# Patient Record
Sex: Male | Born: 1985 | Hispanic: No | Marital: Married | Smoking: Current every day smoker
Health system: Southern US, Community
[De-identification: ages and names within clinical notes are randomized; demographics above are authoritative.]

---

## 2019-08-17 ENCOUNTER — Emergency Department (HOSPITAL_COMMUNITY)
Admission: EM | Admit: 2019-08-17 | Discharge: 2019-08-17 | Disposition: A | Discharge status: Home / Self Care | Payer: Medicaid Other | Attending: Emergency Medicine | Admitting: Emergency Medicine

## 2019-08-17 ENCOUNTER — Encounter (HOSPITAL_COMMUNITY): Payer: Self-pay | Admitting: Emergency Medicine

## 2019-08-17 ENCOUNTER — Other Ambulatory Visit: Payer: Self-pay

## 2019-08-17 DIAGNOSIS — R109 Unspecified abdominal pain: Secondary | ICD-10-CM | POA: Diagnosis not present

## 2019-08-17 DIAGNOSIS — R82998 Other abnormal findings in urine: Secondary | ICD-10-CM | POA: Insufficient documentation

## 2019-08-17 DIAGNOSIS — M545 Low back pain: Secondary | ICD-10-CM | POA: Insufficient documentation

## 2019-08-17 DIAGNOSIS — R300 Dysuria: Secondary | ICD-10-CM | POA: Insufficient documentation

## 2019-08-17 DIAGNOSIS — R3989 Other symptoms and signs involving the genitourinary system: Secondary | ICD-10-CM

## 2019-08-17 LAB — URINALYSIS, ROUTINE W REFLEX MICROSCOPIC
Bilirubin Urine: NEGATIVE
Glucose, UA: NEGATIVE mg/dL
Hgb urine dipstick: NEGATIVE
Ketones, ur: NEGATIVE mg/dL
Leukocytes,Ua: NEGATIVE
Nitrite: NEGATIVE
Protein, ur: NEGATIVE mg/dL
Specific Gravity, Urine: 1.025 (ref 1.005–1.030)
pH: 5 (ref 5.0–8.0)

## 2019-08-17 LAB — COMPREHENSIVE METABOLIC PANEL
ALT: 25 U/L (ref 0–44)
AST: 21 U/L (ref 15–41)
Albumin: 4 g/dL (ref 3.5–5.0)
Alkaline Phosphatase: 68 U/L (ref 38–126)
Anion gap: 9 (ref 5–15)
BUN: 15 mg/dL (ref 6–20)
CO2: 30 mmol/L (ref 22–32)
Calcium: 10 mg/dL (ref 8.9–10.3)
Chloride: 103 mmol/L (ref 98–111)
Creatinine, Ser: 0.9 mg/dL (ref 0.61–1.24)
GFR calc Af Amer: >60 mL/min (ref >60)
GFR calc non Af Amer: >60 mL/min (ref >60)
Glucose, Bld: 101 mg/dL — ABNORMAL HIGH (ref 70–99)
Potassium: 4.3 mmol/L (ref 3.5–5.1)
Sodium: 142 mmol/L (ref 135–145)
Total Bilirubin: 0.4 mg/dL (ref 0.3–1.2)
Total Protein: 7.5 g/dL (ref 6.5–8.1)

## 2019-08-17 LAB — CBC WITH DIFFERENTIAL/PLATELET
Abs Immature Granulocytes: 0.05 10*3/uL (ref 0.00–0.07)
Basophils Absolute: 0 10*3/uL (ref 0.0–0.1)
Basophils Relative: 0 %
Eosinophils Absolute: 0.2 10*3/uL (ref 0.0–0.5)
Eosinophils Relative: 2 %
HCT: 46.1 % (ref 39.0–52.0)
Hemoglobin: 15.1 g/dL (ref 13.0–17.0)
Immature Granulocytes: 1 %
Lymphocytes Relative: 19 %
Lymphs Abs: 1.7 10*3/uL (ref 0.7–4.0)
MCH: 29.3 pg (ref 26.0–34.0)
MCHC: 32.8 g/dL (ref 30.0–36.0)
MCV: 89.3 fL (ref 80.0–100.0)
Monocytes Absolute: 0.6 10*3/uL (ref 0.1–1.0)
Monocytes Relative: 6 %
Neutro Abs: 6.4 10*3/uL (ref 1.7–7.7)
Neutrophils Relative %: 72 %
Platelets: 270 10*3/uL (ref 150–400)
RBC: 5.16 MIL/uL (ref 4.22–5.81)
RDW: 12.9 % (ref 11.5–15.5)
WBC: 9 10*3/uL (ref 4.0–10.5)
nRBC: 0 % (ref 0.0–0.2)

## 2019-08-17 NOTE — ED Triage Notes (Signed)
Patient complains of hematuria since early am. Denies abdominal pain, no back pain but complains of tingling to left side

## 2019-08-17 NOTE — Discharge Instructions (Signed)
Your urinalysis does not show any acute abnormalities. Follow-up with your primary care physician if you continue to have discoloration. Be sure to drink plenty of fluids.

## 2019-08-17 NOTE — ED Notes (Signed)
Patient verbalizes understanding of discharge instructions . Opportunity for questions and answers were provided . Armband removed by staff ,Pt discharged from ED. W/C  offered at D/C  and Declined W/C at D/C and was escorted to lobby by RN.  

## 2019-08-17 NOTE — ED Provider Notes (Signed)
[facility] EMERGENCY DEPARTMENT Provider Note   CSN: [id number] Arrival date & time: 08/17/19  1504     History No chief complaint on file.   Gene Green is a 34 y.o. male.  HPI 34 year old male presents with brown urine/possibly bloody urine.  Started this morning when he first went to the bathroom and then he went again and it was still brownish.  He has a little bit of dysuria at the end of urination over the last week or so.  Some abdominal discomfort like he is distended for a while.  Has chronic low back pain for over a month that is not that bad right now.  No flank pain.  No penile discharge, penile pain, scrotal pain or swelling.  No concern for STI. No muscle pain. Denies feeling dehydrated.  History reviewed. No pertinent past medical history.  There are no problems to display for this patient.   History reviewed. No pertinent surgical history.     No family history on file.  Social History   Tobacco Use  . Smoking status: Not on file  Substance Use Topics  . Alcohol use: Not on file  . Drug use: Not on file    Home Medications Prior to Admission medications   Not on File    Allergies    Patient has no known allergies.  Review of Systems   Review of Systems  Constitutional: Negative for fever.  Gastrointestinal: Negative for abdominal pain.  Genitourinary: Positive for dysuria and hematuria. Negative for discharge, flank pain, penile pain, penile swelling and scrotal swelling.  Musculoskeletal: Positive for back pain.  All other systems reviewed and are negative.   Physical Exam Updated Vital Signs BP 114/78 (BP Location: Right Arm)   Pulse 96   Temp 98.3 F (36.8 C) (Oral)   Resp 16   SpO2 98%   Physical Exam Vitals and nursing note reviewed.  Constitutional:      Appearance: He is well-developed. He is obese.  HENT:     Head: Normocephalic and atraumatic.     Right Ear: External ear normal.     Left Ear: External  ear normal.     Nose: Nose normal.  Eyes:     General:        Right eye: No discharge.        Left eye: No discharge.  Cardiovascular:     Rate and Rhythm: Normal rate and regular rhythm.     Heart sounds: Normal heart sounds.  Pulmonary:     Effort: Pulmonary effort is normal.     Breath sounds: Normal breath sounds.  Abdominal:     Palpations: Abdomen is soft.     Tenderness: There is no abdominal tenderness. There is no right CVA tenderness or left CVA tenderness.  Genitourinary:    Penis: Circumcised. No erythema, tenderness, discharge, swelling or lesions.      Testes:        Right: Tenderness or swelling not present.        Left: Tenderness or swelling not present.  Musculoskeletal:     Cervical back: Neck supple.     Thoracic back: No tenderness.     Lumbar back: No tenderness.  Skin:    General: Skin is warm and dry.  Neurological:     Mental Status: He is alert.  Psychiatric:        Mood and Affect: Mood is not anxious.     ED Results / Procedures /  Treatments   Labs (all labs ordered are listed, but only abnormal results are displayed) Labs Reviewed  COMPREHENSIVE METABOLIC PANEL - Abnormal; Notable for the following components:      Result Value   Glucose, Bld 101 (*)    All other components within normal limits  URINALYSIS, ROUTINE W REFLEX MICROSCOPIC  CBC WITH DIFFERENTIAL/PLATELET    EKG None  Radiology No results found.  Procedures Procedures (including critical care time)  Medications Ordered in ED Medications - No data to display  ED Course  I have reviewed the triage vital signs and the nursing notes.  Pertinent labs & imaging results that were available during my care of the patient were reviewed by me and considered in my medical decision making (see chart for details).    MDM Rules/Calculators/A&P                          Patient's lab work and urine has been reviewed and is very unremarkable.  Urinalysis is completely normal.   He showed me a picture of his urine and it looks more like dark concentrated urine rather than blood.  I encouraged him to drink more fluids.  At this point, with a benign exam, I do not think further work-up is needed.  Follow-up with PCP as needed or if not improving. Final Clinical Impression(s) / ED Diagnoses Final diagnoses:  Urine discoloration    Rx / DC Orders ED Discharge Orders    None       Pricilla Loveless, MD 08/17/19 1920

## 2019-08-30 ENCOUNTER — Other Ambulatory Visit: Payer: Self-pay

## 2019-08-30 ENCOUNTER — Emergency Department (HOSPITAL_COMMUNITY)
Admission: EM | Admit: 2019-08-30 | Discharge: 2019-08-30 | Disposition: A | Discharge status: Home / Self Care | Payer: Medicaid Other | Attending: Emergency Medicine | Admitting: Emergency Medicine

## 2019-08-30 ENCOUNTER — Encounter (HOSPITAL_COMMUNITY): Payer: Self-pay | Admitting: Emergency Medicine

## 2019-08-30 DIAGNOSIS — B369 Superficial mycosis, unspecified: Secondary | ICD-10-CM | POA: Insufficient documentation

## 2019-08-30 DIAGNOSIS — L732 Hidradenitis suppurativa: Secondary | ICD-10-CM

## 2019-08-30 DIAGNOSIS — R21 Rash and other nonspecific skin eruption: Secondary | ICD-10-CM | POA: Diagnosis present

## 2019-08-30 MED ORDER — CLOTRIMAZOLE 1 % EX CREA
TOPICAL_CREAM | CUTANEOUS | 0 refills | Status: AC
Start: 2019-08-30 — End: ?

## 2019-08-30 MED ORDER — CLINDAMYCIN PHOSPHATE 1 % EX SOLN: Freq: Two times a day (BID) | CUTANEOUS | 0 refills | Status: AC

## 2019-08-30 NOTE — ED Notes (Signed)
Pt discharge instructions and prescriptions reviewed with the patient. The patient verbalized understanding of instructions. Pt discharged. 

## 2019-08-30 NOTE — ED Provider Notes (Signed)
[facility] EMERGENCY DEPARTMENT Provider Note   CSN: [id number] Arrival date & time: 08/30/19  3976     History Chief Complaint  Patient presents with   Recurrent Skin Infections   Rash    Gene Green is a 34 y.o. male.  34 year old male with history of hydradenitis presents with complaint of redness and itching in his left axilla. Patient states he had an abscess 2 weeks ago and was seen at [facility] who prescribed 7 days of QID Keflex. Patient completed the course and then went to the health department who prescribed cortisone cream which he has not been using. Patient has been using an acne facewash on his left axilla, now with itching and redness/pain. No fevers, no other complaints or concerns.         History reviewed. No pertinent past medical history.  There are no problems to display for this patient.   History reviewed. No pertinent surgical history.     No family history on file.  Social History   Tobacco Use   Smoking status: Not on file  Substance Use Topics   Alcohol use: Not on file   Drug use: Not on file    Home Medications Prior to Admission medications   Medication Sig Start Date End Date Taking? Authorizing Provider  cephALEXin (KEFLEX) 500 MG capsule Take 500 mg by mouth 4 (four) times daily. 08/18/19  Yes [provider]  Cholecalciferol 1.25 MG (50000 UT) capsule Take 50,000 Units by mouth once a week. 08/27/19  Yes [provider]  clindamycin (CLEOCIN-T) 1 % external solution Apply topically 2 (two) times daily. 08/30/19   Jeannie Fend, PA-C  clotrimazole (LOTRIMIN) 1 % cream Apply to affected area 2 times daily 08/30/19   Jeannie Fend, PA-C    Allergies    Patient has no known allergies.  Review of Systems   Review of Systems  Constitutional: Negative for fever.  Musculoskeletal: Negative for arthralgias and myalgias.  Skin: Positive for rash.  Allergic/Immunologic: Negative for  immunocompromised state.  Neurological: Negative for weakness and numbness.  Hematological: Negative for adenopathy.    Physical Exam Updated Vital Signs BP (!) 139/92    Pulse 93    Temp 98.2 F (36.8 C)    Resp 15    SpO2 100%   Physical Exam Vitals and nursing note reviewed.  Constitutional:      General: He is not in acute distress.    Appearance: He is well-developed. He is not diaphoretic.  HENT:     Head: Normocephalic and atraumatic.  Cardiovascular:     Pulses: Normal pulses.  Pulmonary:     Effort: Pulmonary effort is normal.  Musculoskeletal:        General: No swelling or tenderness.  Skin:    General: Skin is dry.     Findings: Erythema and rash present.       Neurological:     Mental Status: He is alert and oriented to person, place, and time.  Psychiatric:        Behavior: Behavior normal.     ED Results / Procedures / Treatments   Labs (all labs ordered are listed, but only abnormal results are displayed) Labs Reviewed - No data to display  EKG None  Radiology No results found.  Procedures Procedures (including critical care time)  Medications Ordered in ED Medications - No data to display  ED Course  I have reviewed the triage vital signs and  the nursing notes.  Pertinent labs & imaging results that were available during my care of the patient were reviewed by me and considered in my medical decision making (see chart for details).  Clinical Course as of Aug 30 935  Fri Aug 30, 2019  6720 34 year old male with history of hidradenitis presents with complaint of redness and itching to left axilla after taking Keflex for recent infection and now using acne face wash to the area.  Discussed with patient possible contact reaction versus fungal infection in the area.  Recommend clindamycin topically to area for his hidradenitis as well as Lotrimin cream for possible fungal infection.  Patient has not been using the cortisone cream prescribed by  the health department, consider use if not improving after 10 days of Lotrimin.  Patient recently moved to this area, requesting information on dermatologist and PCP, has Medicaid.  Patient given information for [place] and wellness also advised to contact his Medicaid provider and given list of area dermatologist although no dermatology specifically available for referral from our facility.   [LM]    Clinical Course User Index [LM] Alden Hipp   MDM Rules/Calculators/A&P                          Final Clinical Impression(s) / ED Diagnoses Final diagnoses:  Hydradenitis  Fungal infection of skin    Rx / DC Orders ED Discharge Orders         Ordered    clindamycin (CLEOCIN-T) 1 % external solution  2 times daily     Discontinue  Reprint     08/30/19 0924    clotrimazole (LOTRIMIN) 1 % cream     Discontinue  Reprint     08/30/19 0924           Jeannie Fend, PA-C 08/30/19 7939    Terald Sleeper, MD 08/30/19 1023

## 2019-08-30 NOTE — ED Triage Notes (Signed)
Pt reports a boil to L axilla that he was treated with keflex for from [facility], states he is done with abx but boil is still draining and he has a rash around the area. Denies fevers.

## 2019-08-30 NOTE — Discharge Instructions (Addendum)
Apply Clindamycin daily to control the hydradenitis. Apply the Lotrimin to area to help with the redness and itching. Follow up with a primary care provider.

## 2019-10-26 ENCOUNTER — Encounter (HOSPITAL_COMMUNITY): Payer: Self-pay | Admitting: Emergency Medicine

## 2019-10-26 ENCOUNTER — Emergency Department (HOSPITAL_COMMUNITY)
Admission: EM | Admit: 2019-10-26 | Discharge: 2019-10-26 | Disposition: A | Discharge status: Home / Self Care | Payer: Medicaid Other | Attending: Emergency Medicine | Admitting: Emergency Medicine

## 2019-10-26 ENCOUNTER — Emergency Department (HOSPITAL_COMMUNITY): Payer: Medicaid Other

## 2019-10-26 ENCOUNTER — Other Ambulatory Visit: Payer: Self-pay

## 2019-10-26 DIAGNOSIS — Z79899 Other long term (current) drug therapy: Secondary | ICD-10-CM | POA: Diagnosis not present

## 2019-10-26 DIAGNOSIS — J069 Acute upper respiratory infection, unspecified: Secondary | ICD-10-CM | POA: Diagnosis not present

## 2019-10-26 DIAGNOSIS — R05 Cough: Secondary | ICD-10-CM | POA: Diagnosis present

## 2019-10-26 DIAGNOSIS — Z20822 Contact with and (suspected) exposure to covid-19: Secondary | ICD-10-CM | POA: Insufficient documentation

## 2019-10-26 DIAGNOSIS — F17210 Nicotine dependence, cigarettes, uncomplicated: Secondary | ICD-10-CM | POA: Insufficient documentation

## 2019-10-26 LAB — GROUP A STREP BY PCR: Group A Strep by PCR: NOT DETECTED

## 2019-10-26 LAB — SARS CORONAVIRUS 2 BY RT PCR (HOSPITAL ORDER, PERFORMED IN ~~LOC~~ HOSPITAL LAB): SARS Coronavirus 2: NEGATIVE

## 2019-10-26 NOTE — ED Notes (Signed)
Patient transported to X-ray 

## 2019-10-26 NOTE — ED Notes (Signed)
Patient Alert and oriented to baseline. Stable and ambulatory to baseline. Patient verbalized understanding of the discharge instructions.  Patient belongings were taken by the patient.   

## 2019-10-26 NOTE — ED Provider Notes (Signed)
[facility] EMERGENCY DEPARTMENT Provider Note   CSN: [id number] Arrival date & time: 10/26/19  0831     History Chief Complaint  Patient presents with  . Cough  . Sore Throat    Gene Green is a 34 y.o. male who presents to the ED today with complaint of gradual onset, constant, dry cough x 3 days. Pt also complains of a sore throat and pain to his left ribs. He reports that his wife and child had similar symptoms earlier in the week - his wife tested negative for COVID. Pt is fully vaccinated; received Moderna vaccine in April and May. He has been taking OTC medication without relief. Denies fevers however has had some chills. No other complaints at this time.   The history is provided by the patient and medical records.       History reviewed. No pertinent past medical history.  There are no problems to display for this patient.   History reviewed. No pertinent surgical history.     No family history on file.  Social History   Tobacco Use  . Smoking status: Current Every Day Smoker  . Smokeless tobacco: Never Used  Substance Use Topics  . Alcohol use: Not Currently  . Drug use: Not Currently    Home Medications Prior to Admission medications   Medication Sig Start Date End Date Taking? Authorizing Provider  cephALEXin (KEFLEX) 500 MG capsule Take 500 mg by mouth 4 (four) times daily. Patient not taking: Reported on 08/30/2019 08/18/19   [provider]  Cholecalciferol 1.25 MG (50000 UT) capsule Take 50,000 Units by mouth once a week. 08/27/19   [provider]  clindamycin (CLEOCIN-T) 1 % external solution Apply topically 2 (two) times daily. 08/30/19   Jeannie Fend, PA-C  clotrimazole (LOTRIMIN) 1 % cream Apply to affected area 2 times daily 08/30/19   Jeannie Fend, PA-C    Allergies    Patient has no known allergies.  Review of Systems   Review of Systems  Constitutional: Positive for chills and fatigue. Negative for  fever.  HENT: Positive for sore throat. Negative for trouble swallowing.   Respiratory: Positive for cough. Negative for shortness of breath.     Physical Exam Updated Vital Signs BP (!) 132/114 (BP Location: Left Arm)   Pulse 97   Temp 98.1 F (36.7 C) (Oral)   Resp 18   SpO2 100%   Physical Exam Vitals and nursing note reviewed.  Constitutional:      Appearance: He is not ill-appearing or diaphoretic.  HENT:     Head: Normocephalic and atraumatic.     Right Ear: Tympanic membrane and ear canal normal.     Left Ear: Tympanic membrane and ear canal normal.     Mouth/Throat:     Mouth: Mucous membranes are moist.     Pharynx: Uvula midline. Posterior oropharyngeal erythema present. No oropharyngeal exudate or uvula swelling.     Comments: + post nasal drip Eyes:     Conjunctiva/sclera: Conjunctivae normal.  Cardiovascular:     Rate and Rhythm: Normal rate and regular rhythm.     Heart sounds: Normal heart sounds.  Pulmonary:     Effort: Pulmonary effort is normal.     Breath sounds: Normal breath sounds. No wheezing, rhonchi or rales.     Comments: Actively coughing in the room. Able to speak in full sentences without difficulty. Satting 100% on RA.  Skin:    General: Skin is warm  and dry.     Coloration: Skin is not jaundiced.  Neurological:     Mental Status: He is alert.     ED Results / Procedures / Treatments   Labs (all labs ordered are listed, but only abnormal results are displayed) Labs Reviewed  SARS CORONAVIRUS 2 BY RT PCR (HOSPITAL ORDER, PERFORMED IN  HOSPITAL LAB)  GROUP A STREP BY PCR    EKG None  Radiology DG Chest 2 View  Result Date: 10/26/2019 CLINICAL DATA:  Cough and congestion.  Fever. EXAM: CHEST - 2 VIEW COMPARISON:  None. FINDINGS: Lungs are clear. Heart size and pulmonary vascularity are normal. No adenopathy. No bone lesions. IMPRESSION: No abnormality noted. Electronically Signed   By: Bretta Bang III M.D.   On:  10/26/2019 11:23    Procedures Procedures (including critical care time)  Medications Ordered in ED Medications - No data to display  ED Course  I have reviewed the triage vital signs and the nursing notes.  Pertinent labs & imaging results that were available during my care of the patient were reviewed by me and considered in my medical decision making (see chart for details).    MDM Rules/Calculators/A&P                          34 year old male who presents to the ED today complaining of cough, sore throat, left-sided pain for the past couple of days.  His wife and child recently had similar symptoms, left is negative for Covid.  Patient is vaccinated.  On arrival to the ED patient is afebrile, nontachycardic nontachypneic.  He was tested for strep as well as Covid while in the waiting room, both are negative.  On my exam today patient is actively coughing in the room however lungs are clear to auscultation bilaterally.  He does not appear to be in any type of respiratory distress.  He is noted to have some postnasal drip on exam as well.  He reports he feels like he is a pressure in his head.  I suspect this is related to his congestion.  We will plan for chest x-ray at this time to evaluate for any type of pneumonia however I suspect his symptoms are due to an upper respiratory infection with cough.  We will plan for symptomatic treatment.   CXR negative. Will plan to discharge home at this time. Pt is requesting something to help with the congestion he feels in his chest - recommend Mucinex OTC. Will provide flonase as well as I feel this will help with dry out his nasal passages/post nasal drip. Pt instructed to follow up with his PCP. He is in agreement with plan and stable for discharge home.   This note was prepared using Dragon voice recognition software and may include unintentional dictation errors due to the inherent limitations of voice recognition software.  Gene Green was  evaluated in Emergency Department on 10/26/2019 for the symptoms described in the history of present illness. He was evaluated in the context of the global COVID-19 pandemic, which necessitated consideration that the patient might be at risk for infection with the SARS-CoV-2 virus that causes COVID-19. Institutional protocols and algorithms that pertain to the evaluation of patients at risk for COVID-19 are in a state of rapid change based on information released by regulatory bodies including the CDC and federal and state organizations. These policies and algorithms were followed during the patient's care in the ED.  Final Clinical Impression(s) / ED Diagnoses Final diagnoses:  Viral URI with cough    Rx / DC Orders ED Discharge Orders    None       Discharge Instructions     Your lab work and chest xray did not show any abnormalities. I suspect your symptoms are related to a virus and should go away in a few days.   I would recommend OTC Mucinex DM as well as Flonase to help with your congestion.   Follow up with your PCP regarding your ED visit. If you do not have one you can follow up with [facility] Wellness for primary care needs.   Return to the ED for any worsening symptoms       Tanda Rockers, PA-C 10/26/19 1205    Terald Sleeper, MD 10/26/19 [phone]

## 2019-10-26 NOTE — ED Triage Notes (Signed)
Pt. Stated, I ve had sore throat , cough since Wednesday. My left side hurts also.

## 2019-10-26 NOTE — Discharge Instructions (Signed)
Your lab work and chest xray did not show any abnormalities. I suspect your symptoms are related to a virus and should go away in a few days.   I would recommend OTC Mucinex DM as well as Flonase to help with your congestion.   Follow up with your PCP regarding your ED visit. If you do not have one you can follow up with [facility] Well[facility] for primary care needs.   Return to the ED for any worsening symptoms

## 2020-02-29 ENCOUNTER — Emergency Department (HOSPITAL_COMMUNITY)
Admission: EM | Admit: 2020-02-29 | Discharge: 2020-02-29 | Disposition: A | Discharge status: Home / Self Care | Payer: BC Managed Care – PPO | Attending: Emergency Medicine | Admitting: Emergency Medicine

## 2020-02-29 ENCOUNTER — Other Ambulatory Visit: Payer: Self-pay

## 2020-02-29 ENCOUNTER — Emergency Department (HOSPITAL_COMMUNITY): Payer: BC Managed Care – PPO

## 2020-02-29 DIAGNOSIS — U071 COVID-19: Secondary | ICD-10-CM | POA: Insufficient documentation

## 2020-02-29 DIAGNOSIS — R509 Fever, unspecified: Secondary | ICD-10-CM | POA: Diagnosis present

## 2020-02-29 DIAGNOSIS — F17200 Nicotine dependence, unspecified, uncomplicated: Secondary | ICD-10-CM | POA: Insufficient documentation

## 2020-02-29 LAB — COMPREHENSIVE METABOLIC PANEL
ALT: 21 U/L (ref 0–44)
AST: 21 U/L (ref 15–41)
Albumin: 4 g/dL (ref 3.5–5.0)
Alkaline Phosphatase: 53 U/L (ref 38–126)
Anion gap: 10 (ref 5–15)
BUN: 13 mg/dL (ref 6–20)
CO2: 27 mmol/L (ref 22–32)
Calcium: 9.5 mg/dL (ref 8.9–10.3)
Chloride: 98 mmol/L (ref 98–111)
Creatinine, Ser: 0.92 mg/dL (ref 0.61–1.24)
GFR, Estimated: >60 mL/min (ref >60)
Glucose, Bld: 126 mg/dL — ABNORMAL HIGH (ref 70–99)
Potassium: 3.6 mmol/L (ref 3.5–5.1)
Sodium: 135 mmol/L (ref 135–145)
Total Bilirubin: 0.6 mg/dL (ref 0.3–1.2)
Total Protein: 7.3 g/dL (ref 6.5–8.1)

## 2020-02-29 LAB — URINALYSIS, ROUTINE W REFLEX MICROSCOPIC
Bilirubin Urine: NEGATIVE
Glucose, UA: NEGATIVE mg/dL
Hgb urine dipstick: NEGATIVE
Ketones, ur: 5 mg/dL — AB
Leukocytes,Ua: NEGATIVE
Nitrite: NEGATIVE
Protein, ur: NEGATIVE mg/dL
Specific Gravity, Urine: 1.024 (ref 1.005–1.030)
pH: 6 (ref 5.0–8.0)

## 2020-02-29 LAB — CBC
HCT: 46 % (ref 39.0–52.0)
Hemoglobin: 14.8 g/dL (ref 13.0–17.0)
MCH: 28.4 pg (ref 26.0–34.0)
MCHC: 32.2 g/dL (ref 30.0–36.0)
MCV: 88.3 fL (ref 80.0–100.0)
Platelets: 233 10*3/uL (ref 150–400)
RBC: 5.21 MIL/uL (ref 4.22–5.81)
RDW: 14.1 % (ref 11.5–15.5)
WBC: 6.1 10*3/uL (ref 4.0–10.5)
nRBC: 0 % (ref 0.0–0.2)

## 2020-02-29 LAB — LIPASE, BLOOD: Lipase: 18 U/L (ref 11–51)

## 2020-02-29 LAB — POC SARS CORONAVIRUS 2 AG -  ED: SARS Coronavirus 2 Ag: POSITIVE — AB

## 2020-02-29 MED ORDER — ACETAMINOPHEN 500 MG PO TABS
1000.0000 mg | ORAL_TABLET | Freq: Once | ORAL | Status: AC
  Administered 2020-02-29: 1000 mg via ORAL
  Filled 2020-02-29: qty 2

## 2020-02-29 NOTE — ED Provider Notes (Signed)
[facility] EMERGENCY DEPARTMENT Provider Note   CSN: [id number] Arrival date & time: 02/29/20  1138     History Chief Complaint  Patient presents with  . Fever    Emry Barbato is a 35 y.o. male.  The history is provided by the patient.  Fever Temp source:  Subjective Severity:  Mild Onset quality:  Gradual Timing:  Intermittent Progression:  Waxing and waning Chronicity:  New Relieved by:  Nothing Worsened by:  Nothing Associated symptoms: chills and myalgias   Associated symptoms: no chest pain, no cough, no diarrhea, no dysuria, no ear pain, no rash, no sore throat and no vomiting        History reviewed. No pertinent past medical history.  There are no problems to display for this patient.   History reviewed. No pertinent surgical history.     No family history on file.  Social History   Tobacco Use  . Smoking status: Current Every Day Smoker  . Smokeless tobacco: Never Used  Substance Use Topics  . Alcohol use: Not Currently  . Drug use: Not Currently    Home Medications Prior to Admission medications   Medication Sig Start Date End Date Taking? Authorizing Provider  cephALEXin (KEFLEX) 500 MG capsule Take 500 mg by mouth 4 (four) times daily. Patient not taking: Reported on 08/30/2019 08/18/19   [provider]  Cholecalciferol 1.25 MG (50000 UT) capsule Take 50,000 Units by mouth once a week. 08/27/19   [provider]  clindamycin (CLEOCIN-T) 1 % external solution Apply topically 2 (two) times daily. 08/30/19   Jeannie Fend, PA-C  clotrimazole (LOTRIMIN) 1 % cream Apply to affected area 2 times daily 08/30/19   Jeannie Fend, PA-C    Allergies    Patient has no known allergies.  Review of Systems   Review of Systems  Constitutional: Positive for chills and fever.  HENT: Negative for ear pain and sore throat.   Eyes: Negative for pain and visual disturbance.  Respiratory: Negative for cough and shortness  of breath.   Cardiovascular: Negative for chest pain and palpitations.  Gastrointestinal: Positive for abdominal pain (epigastric). Negative for diarrhea and vomiting.  Genitourinary: Negative for dysuria and hematuria.  Musculoskeletal: Positive for myalgias. Negative for arthralgias and back pain.  Skin: Negative for color change and rash.  Neurological: Negative for seizures and syncope.  All other systems reviewed and are negative.   Physical Exam Updated Vital Signs  ED Triage Vitals  Enc Vitals Group     BP 02/29/20 1211 115/81     Pulse Rate 02/29/20 1211 (!) 129     Resp 02/29/20 1211 20     Temp 02/29/20 1211 99.6 F (37.6 C)     Temp Source 02/29/20 1211 Oral     SpO2 02/29/20 1211 100 %     Weight --      Height --      Head Circumference --      Peak Flow --      Pain Score 02/29/20 1213 4     Pain Loc --      Pain Edu? --      Excl. in GC? --     Physical Exam Vitals and nursing note reviewed.  Constitutional:      General: He is not in acute distress.    Appearance: He is well-developed and well-nourished. He is not ill-appearing.  HENT:     Head: Normocephalic and atraumatic.  Nose: Nose normal.     Mouth/Throat:     Mouth: Mucous membranes are moist.  Eyes:     Extraocular Movements: Extraocular movements intact.     Conjunctiva/sclera: Conjunctivae normal.     Pupils: Pupils are equal, round, and reactive to light.  Cardiovascular:     Rate and Rhythm: Normal rate and regular rhythm.     Heart sounds: No murmur heard.   Pulmonary:     Effort: Pulmonary effort is normal. No respiratory distress.     Breath sounds: Normal breath sounds.  Abdominal:     General: Abdomen is flat.     Palpations: Abdomen is soft.     Tenderness: There is no abdominal tenderness.  Musculoskeletal:        General: No edema.     Cervical back: Neck supple.  Skin:    General: Skin is warm and dry.     Capillary Refill: Capillary refill takes less than 2  seconds.  Neurological:     General: No focal deficit present.     Mental Status: He is alert.  Psychiatric:        Mood and Affect: Mood is anxious.     ED Results / Procedures / Treatments   Labs (all labs ordered are listed, but only abnormal results are displayed) Labs Reviewed  COMPREHENSIVE METABOLIC PANEL - Abnormal; Notable for the following components:      Result Value   Glucose, Bld 126 (*)    All other components within normal limits  URINALYSIS, ROUTINE W REFLEX MICROSCOPIC - Abnormal; Notable for the following components:   Ketones, ur 5 (*)    All other components within normal limits  POC SARS CORONAVIRUS 2 AG -  ED - Abnormal; Notable for the following components:   SARS Coronavirus 2 Ag POSITIVE (*)    All other components within normal limits  LIPASE, BLOOD  CBC    EKG EKG Interpretation  Date/Time:  Saturday February 29 2020 12:07:41 EST Ventricular Rate:  126 PR Interval:  138 QRS Duration: 74 QT Interval:  302 QTC Calculation: 437 R Axis:   49 Text Interpretation: Sinus tachycardia Otherwise normal ECG Confirmed by Virgina Norfolk [phone]) on 02/29/2020 3:08:35 PM   Radiology DG Chest Portable 1 View  Result Date: 02/29/2020 CLINICAL DATA:  Fever, COVID-19. EXAM: PORTABLE CHEST 1 VIEW COMPARISON:  October 26, 2019. FINDINGS: The heart size and mediastinal contours are within normal limits. Both lungs are clear. The visualized skeletal structures are unremarkable. IMPRESSION: No active disease. Electronically Signed   By: Lupita Raider M.D.   On: 02/29/2020 16:00    Procedures Procedures (including critical care time)  Medications Ordered in ED Medications  acetaminophen (TYLENOL) tablet 1,000 mg (1,000 mg Oral Given 02/29/20 1632)    ED Course  I have reviewed the triage vital signs and the nursing notes.  Pertinent labs & imaging results that were available during my care of the patient were reviewed by me and considered in my medical  decision making (see chart for details).    MDM Rules/Calculators/A&P                          Amman Bartel is a 35 year old male with no significant medical history presents the ED with fever, body aches.  Normal vitals except for mild tachycardia.  Does have a low-grade temperature.  Overall well-appearing.  Has had some upper abdominal pain that goes into his back.  Lipase is normal and doubt pancreatitis.  Does not have active chest pain or abdominal pain currently now.  EKG shows sinus rhythm.  No ischemic changes.  Chest x-ray negative for infection.  No significant anemia, electrolyte abnormality, kidney injury otherwise.  He is vaccinated against COVID but he did test positive for COVID which is likely the cause of his symptoms today.  Tachycardia improved with oral fluids and Tylenol.  Patient is mildly anxious but tried to give him reassurance that overall his oxygen status was normal and his chest x-ray was reassuring.  Gave him return precautions and discharge patient to the ED in good condition  This chart was dictated using voice recognition software.  Despite best efforts to proofread,  errors can occur which can change the documentation meaning.  Raheim Beutler was evaluated in Emergency Department on 02/29/2020 for the symptoms described in the history of present illness. He was evaluated in the context of the global COVID-19 pandemic, which necessitated consideration that the patient might be at risk for infection with the SARS-CoV-2 virus that causes COVID-19. Institutional protocols and algorithms that pertain to the evaluation of patients at risk for COVID-19 are in a state of rapid change based on information released by regulatory bodies including the CDC and federal and state organizations. These policies and algorithms were followed during the patient's care in the ED.   Final Clinical Impression(s) / ED Diagnoses Final diagnoses:  COVID-19    Rx / DC Orders ED Discharge  Orders    None       Virgina Norfolk, DO 02/29/20 1702

## 2020-02-29 NOTE — ED Triage Notes (Signed)
Patient complains of low grade fever and body aches since last night. Reports that has been vaccinated. Alert and oriented, NAD. Patient in no distress. Also complains of 1 episode of CP with radiation to back

## 2021-08-19 IMAGING — CR DG CHEST 2V
2 series · 2 of 2 positions shown · non-contrast
Comparison: None.

CLINICAL DATA: Cough and congestion.  Fever.

EXAM:
CHEST - 2 VIEW

[chest pa]
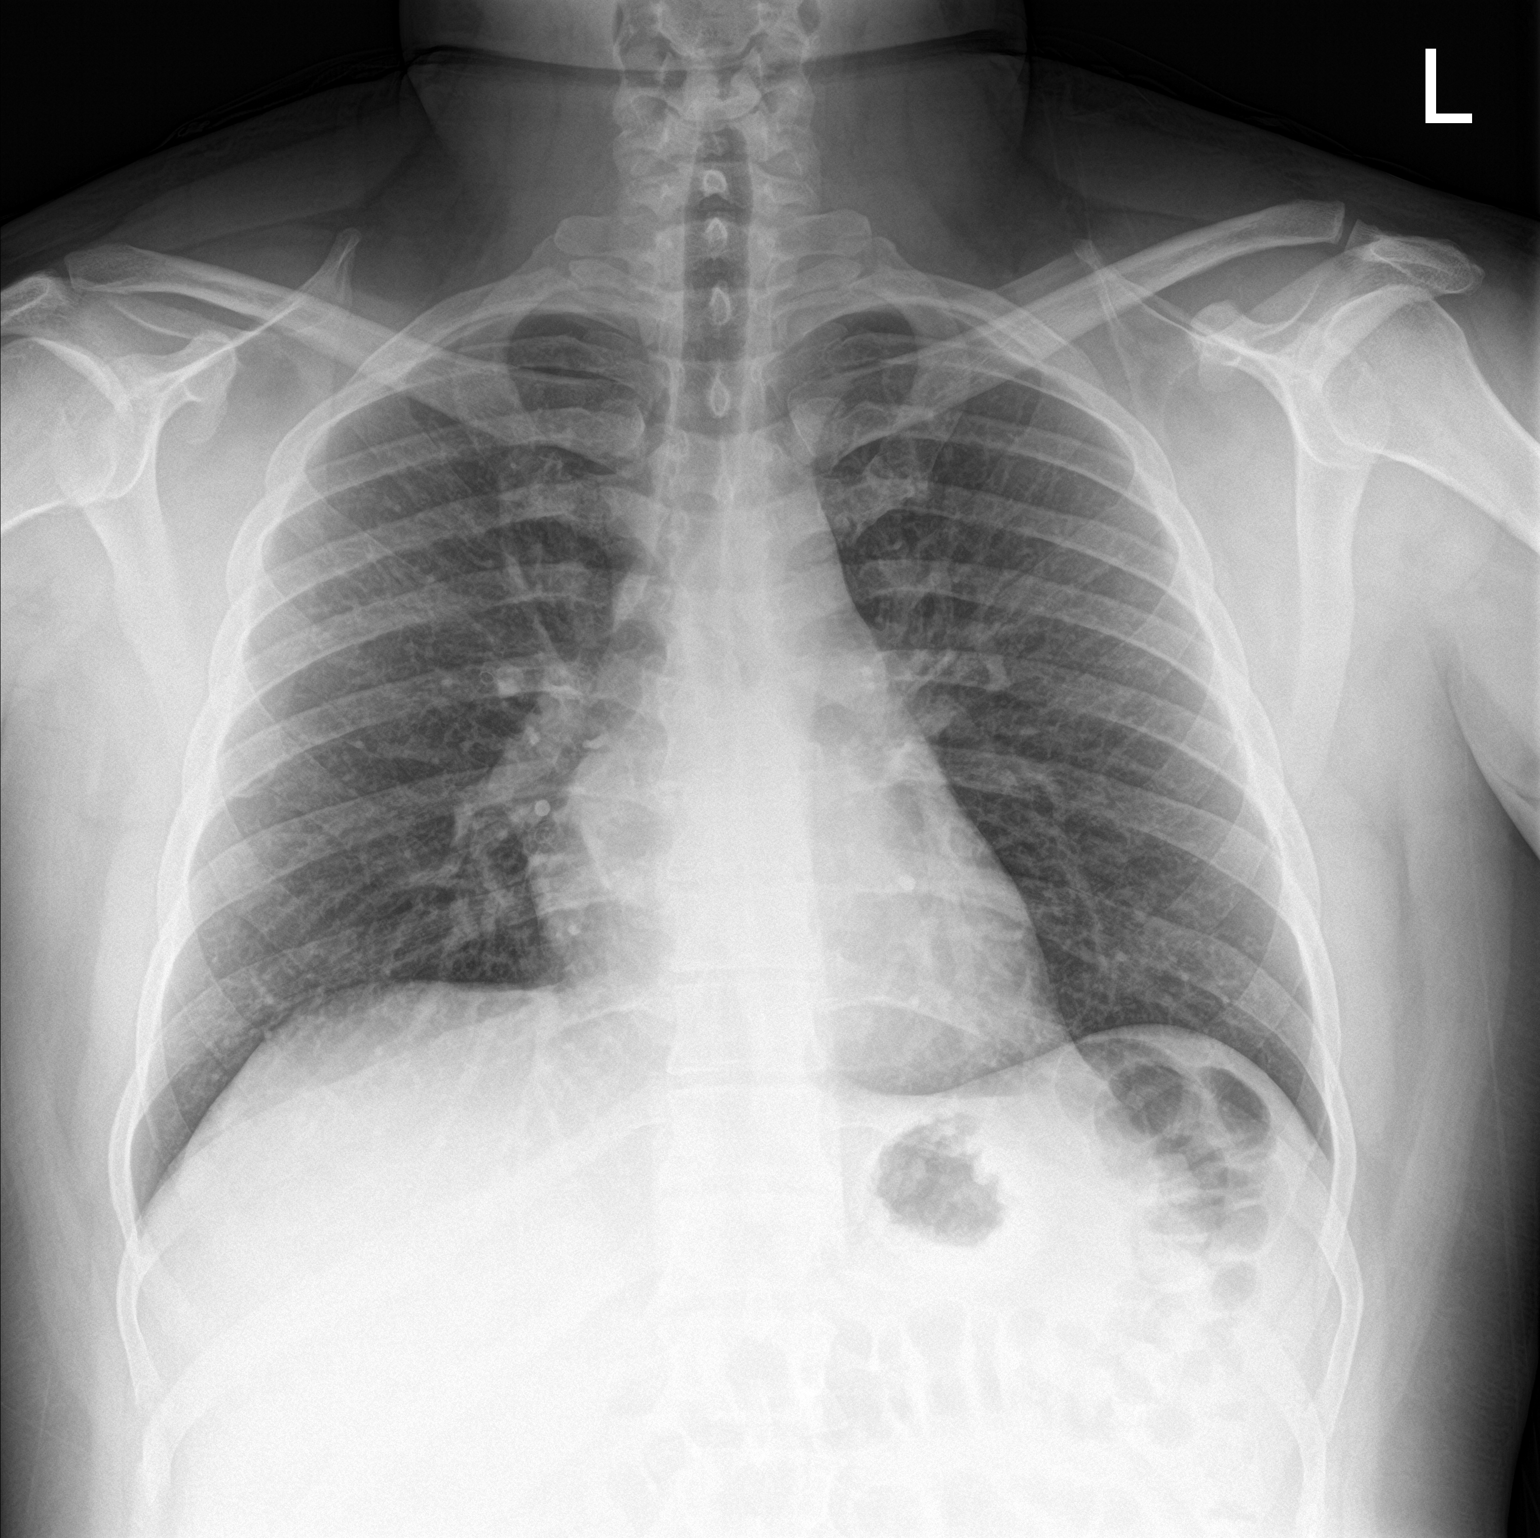

[chest lat]
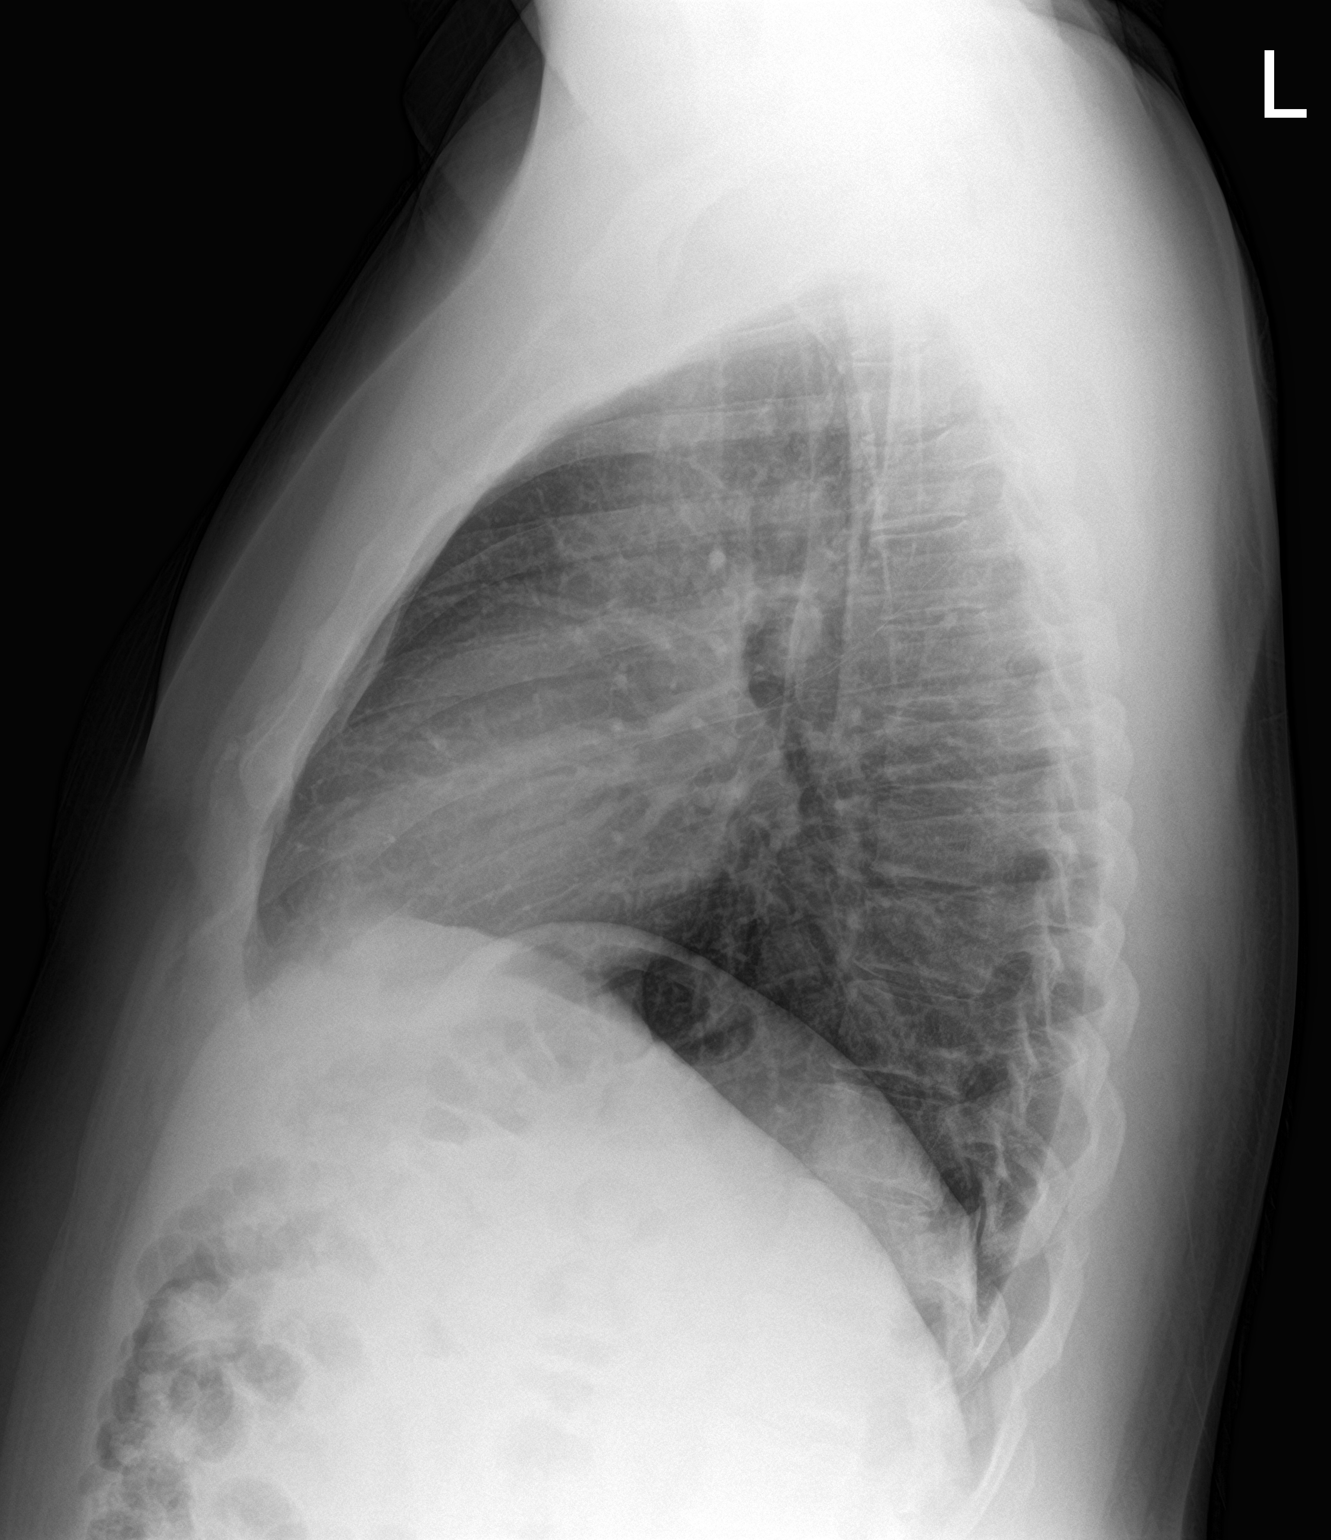

[2 of 2 positions shown; findings below may reference images not displayed]

FINDINGS: Lungs are clear. Heart size and pulmonary vascularity are normal. No
adenopathy. No bone lesions.
IMPRESSION: No abnormality noted.

## 2021-09-11 ENCOUNTER — Emergency Department (HOSPITAL_COMMUNITY)
Admission: EM | Admit: 2021-09-11 | Discharge: 2021-09-11 | Disposition: A | Discharge status: Home / Self Care | Payer: BC Managed Care – PPO | Attending: Emergency Medicine | Admitting: Emergency Medicine

## 2021-09-11 ENCOUNTER — Other Ambulatory Visit: Payer: Self-pay

## 2021-09-11 ENCOUNTER — Emergency Department (HOSPITAL_COMMUNITY): Payer: BC Managed Care – PPO

## 2021-09-11 DIAGNOSIS — R0789 Other chest pain: Secondary | ICD-10-CM | POA: Diagnosis not present

## 2021-09-11 DIAGNOSIS — R079 Chest pain, unspecified: Secondary | ICD-10-CM | POA: Diagnosis present

## 2021-09-11 LAB — HEPATIC FUNCTION PANEL
ALT: 32 U/L (ref 0–44)
AST: 27 U/L (ref 15–41)
Albumin: 4 g/dL (ref 3.5–5.0)
Alkaline Phosphatase: 62 U/L (ref 38–126)
Bilirubin, Direct: 0.1 mg/dL (ref 0.0–0.2)
Indirect Bilirubin: 0.3 mg/dL (ref 0.3–0.9)
Total Bilirubin: 0.4 mg/dL (ref 0.3–1.2)
Total Protein: 8.1 g/dL (ref 6.5–8.1)

## 2021-09-11 LAB — CBC
HCT: 48.6 % (ref 39.0–52.0)
Hemoglobin: 16.1 g/dL (ref 13.0–17.0)
MCH: 28.8 pg (ref 26.0–34.0)
MCHC: 33.1 g/dL (ref 30.0–36.0)
MCV: 86.8 fL (ref 80.0–100.0)
Platelets: 331 10*3/uL (ref 150–400)
RBC: 5.6 MIL/uL (ref 4.22–5.81)
RDW: 13.6 % (ref 11.5–15.5)
WBC: 9.3 10*3/uL (ref 4.0–10.5)
nRBC: 0 % (ref 0.0–0.2)

## 2021-09-11 LAB — URINALYSIS, ROUTINE W REFLEX MICROSCOPIC
Bilirubin Urine: NEGATIVE
Glucose, UA: NEGATIVE mg/dL
Hgb urine dipstick: NEGATIVE
Ketones, ur: NEGATIVE mg/dL
Leukocytes,Ua: NEGATIVE
Nitrite: NEGATIVE
Protein, ur: NEGATIVE mg/dL
Specific Gravity, Urine: 1.02 (ref 1.005–1.030)
pH: 5 (ref 5.0–8.0)

## 2021-09-11 LAB — BASIC METABOLIC PANEL
Anion gap: 9 (ref 5–15)
BUN: 17 mg/dL (ref 6–20)
CO2: 26 mmol/L (ref 22–32)
Calcium: 10 mg/dL (ref 8.9–10.3)
Chloride: 102 mmol/L (ref 98–111)
Creatinine, Ser: 1.05 mg/dL (ref 0.61–1.24)
GFR, Estimated: >60 mL/min (ref >60)
Glucose, Bld: 120 mg/dL — ABNORMAL HIGH (ref 70–99)
Potassium: 4.2 mmol/L (ref 3.5–5.1)
Sodium: 137 mmol/L (ref 135–145)

## 2021-09-11 LAB — TROPONIN I (HIGH SENSITIVITY)
Troponin I (High Sensitivity): <2 ng/L (ref <18)
Troponin I (High Sensitivity): 2 ng/L (ref <18)

## 2021-09-11 LAB — LIPASE, BLOOD: Lipase: 25 U/L (ref 11–51)

## 2021-09-11 MED ORDER — ALUM & MAG HYDROXIDE-SIMETH 200-200-20 MG/5ML PO SUSP
30.0000 mL | Freq: Once | ORAL | Status: AC
  Administered 2021-09-11: 30 mL via ORAL
  Filled 2021-09-11: qty 30

## 2021-09-11 MED ORDER — KETOROLAC TROMETHAMINE 30 MG/ML IJ SOLN
30.0000 mg | Freq: Once | INTRAMUSCULAR | Status: AC
Start: 2021-09-11 — End: 2021-09-11
  Administered 2021-09-11: 30 mg via INTRAVENOUS
  Filled 2021-09-11: qty 1

## 2021-09-11 MED ORDER — IOHEXOL 350 MG/ML SOLN
100.0000 mL | Freq: Once | INTRAVENOUS | Status: AC | PRN
  Administered 2021-09-11: 100 mL via INTRAVENOUS

## 2021-09-11 MED ORDER — PANTOPRAZOLE SODIUM 40 MG PO TBEC: 40.0000 mg | DELAYED_RELEASE_TABLET | Freq: Every day | ORAL | 0 refills | Status: AC

## 2021-09-11 NOTE — ED Provider Notes (Signed)
[facility] EMERGENCY DEPARTMENT Provider Note   CSN: [id number] Arrival date & time: 09/11/21  1215     History  Chief Complaint  Patient presents with   Chest Pain    Gene Green is a 36 y.o. male.  Pt is a 36 yo male with a pmhx significant for hidradenitis.  He comes in today with chest pain that has been going on for 2 weeks.  He said it's in the center of his chest, but he also has some pain on the left side of his chest.  He said it hurts with mvmt.  He denies sob.  No fevers.  No cough.  No cough.  No fever. He said he was put on Accutane and on Gemfibrozil for hidradenitis.        Home Medications Prior to Admission medications   Medication Sig Start Date End Date Taking? Authorizing Provider  CLARAVIS 20 MG capsule Take 20 mg by mouth every evening. 08/16/21  Yes [provider]  gemfibrozil (LOPID) 600 MG tablet Take 600 mg by mouth 2 (two) times daily before a meal. 08/19/21  Yes [provider]  hydrocortisone 2.5 % cream Apply 1 Application topically daily as needed (dry skin). 07/19/21  Yes [provider]  pantoprazole (PROTONIX) 40 MG tablet Take 1 tablet (40 mg total) by mouth daily. 09/11/21  Yes Jacalyn Lefevre, MD  clindamycin (CLEOCIN-T) 1 % external solution Apply topically 2 (two) times daily. Patient not taking: Reported on 09/11/2021 08/30/19   Jeannie Fend, PA-C  clotrimazole (LOTRIMIN) 1 % cream Apply to affected area 2 times daily Patient not taking: Reported on 09/11/2021 08/30/19   Jeannie Fend, PA-C      Allergies    Patient has no known allergies.    Review of Systems   Review of Systems  Cardiovascular:  Positive for chest pain.  All other systems reviewed and are negative.   Physical Exam Updated Vital Signs BP 110/74   Pulse 77   Temp 98.1 F (36.7 C) (Oral)   Resp 14   SpO2 99%  Physical Exam Vitals and nursing note reviewed.  Constitutional:      Appearance: He is well-developed. He  is obese.  HENT:     Head: Normocephalic and atraumatic.  Eyes:     Extraocular Movements: Extraocular movements intact.     Pupils: Pupils are equal, round, and reactive to light.  Cardiovascular:     Rate and Rhythm: Normal rate and regular rhythm.     Heart sounds: Normal heart sounds.  Pulmonary:     Effort: Pulmonary effort is normal.     Breath sounds: Normal breath sounds.  Chest:    Abdominal:     General: Bowel sounds are normal.     Palpations: Abdomen is soft.  Musculoskeletal:        General: Normal range of motion.     Cervical back: Normal range of motion and neck supple.  Skin:    General: Skin is warm.     Capillary Refill: Capillary refill takes less than 2 seconds.  Neurological:     General: No focal deficit present.     Mental Status: He is alert and oriented to person, place, and time.  Psychiatric:        Mood and Affect: Mood normal.        Behavior: Behavior normal.     ED Results / Procedures / Treatments   Labs (all labs ordered are  listed, but only abnormal results are displayed) Labs Reviewed  BASIC METABOLIC PANEL - Abnormal; Notable for the following components:      Result Value   Glucose, Bld 120 (*)    All other components within normal limits  URINALYSIS, ROUTINE W REFLEX MICROSCOPIC - Abnormal; Notable for the following components:   APPearance HAZY (*)    All other components within normal limits  CBC  LIPASE, BLOOD  HEPATIC FUNCTION PANEL  TROPONIN I (HIGH SENSITIVITY)  TROPONIN I (HIGH SENSITIVITY)    EKG EKG Interpretation  Date/Time:  Saturday September 11 2021 12:20:28 EDT Ventricular Rate:  103 PR Interval:  140 QRS Duration: 76 QT Interval:  316 QTC Calculation: 413 R Axis:   42 Text Interpretation: Sinus tachycardia Otherwise normal ECG When compared with ECG of 29-Feb-2020 12:07, PREVIOUS ECG IS PRESENT Since last tracing rate slower Confirmed by Jacalyn Lefevre [phone]) on 09/11/2021 12:51:48 PM  Radiology CT Angio  Chest PE W and/or Wo Contrast  Result Date: 09/11/2021 CLINICAL DATA:  High probability for pulmonary embolism. Central chest pain. EXAM: CT ANGIOGRAPHY CHEST WITH CONTRAST TECHNIQUE: Multidetector CT imaging of the chest was performed using the standard protocol during bolus administration of intravenous contrast. Multiplanar CT image reconstructions and MIPs were obtained to evaluate the vascular anatomy. RADIATION DOSE REDUCTION: This exam was performed according to the departmental dose-optimization program which includes automated exposure control, adjustment of the mA and/or kV according to patient size and/or use of iterative reconstruction technique. CONTRAST:  OMNIPAQUE IOHEXOL 350 MG/ML SOLN COMPARISON:  Chest x-ray same day FINDINGS: Cardiovascular: Satisfactory opacification of the pulmonary arteries to the segmental level. No evidence of pulmonary embolism. Normal heart size. No pericardial effusion. Mediastinum/Nodes: No enlarged mediastinal, hilar, or axillary lymph nodes. Thyroid gland, trachea, and esophagus demonstrate no significant findings. Lungs/Pleura: Lungs are clear. No pleural effusion or pneumothorax. Upper Abdomen: No acute abnormality. Musculoskeletal: No chest wall abnormality. No acute or significant osseous findings. Review of the MIP images confirms the above findings. IMPRESSION: 1. No evidence for pulmonary embolism. 2. No acute cardiopulmonary process. Electronically Signed   By: Darliss Cheney M.D.   On: 09/11/2021 16:52   DG Chest 2 View  Result Date: 09/11/2021 CLINICAL DATA:  cp EXAM: CHEST - 2 VIEW COMPARISON:  February 29, 2020 FINDINGS: The heart size and mediastinal contours are within normal limits. Both lungs are clear and stable. The visualized skeletal structures are unremarkable. IMPRESSION: No active cardiopulmonary disease. Electronically Signed   By: Marjo Bicker M.D.   On: 09/11/2021 13:07    Procedures Procedures    Medications Ordered in  ED Medications  alum & mag hydroxide-simeth (MAALOX/MYLANTA) 200-200-20 MG/5ML suspension 30 mL (has no administration in time range)  ketorolac (TORADOL) 30 MG/ML injection 30 mg (30 mg Intravenous Given 09/11/21 1546)  iohexol (OMNIPAQUE) 350 MG/ML injection 100 mL (100 mLs Intravenous Contrast Given 09/11/21 1638)    ED Course/ Medical Decision Making/ A&P                           Medical Decision Making Amount and/or Complexity of Data Reviewed Labs: ordered. Radiology: ordered.  Risk OTC drugs. Prescription drug management.   This patient presents to the ED for concern of chest pain, this involves an extensive number of treatment options, and is a complaint that carries with it a high risk of complications and morbidity.  The differential diagnosis includes cardiac, pulm, gi   Co morbidities  that complicate the patient evaluation  hidradenitis   Additional history obtained:  Additional history obtained from epic chart review  Lab Tests:  I Ordered, and personally interpreted labs.  The pertinent results include:  cbc nl, ua nl, bmp nl, trop nl   Imaging Studies ordered:  I ordered imaging studies including cxr and ct chest  I independently visualized and interpreted imaging which showed  CXR: IMPRESSION:  No active cardiopulmonary disease.  CT chest: IMPRESSION:  1. No evidence for pulmonary embolism.  2. No acute cardiopulmonary process.   I agree with the radiologist interpretation   Cardiac Monitoring:  The patient was maintained on a cardiac monitor.  I personally viewed and interpreted the cardiac monitored which showed an underlying rhythm of: nsr   Medicines ordered and prescription drug management:  I ordered medication including toradol  for pain  Reevaluation of the patient after these medicines showed that the patient improved I have reviewed the patients home medicines and have made adjustments as needed   Test Considered:  Ct  chest   Critical Interventions:  Pain control    Problem List / ED Course:  CP:  atypical.  Pain is with mvmt and with breathing.  He has a nl ekg and 2 neg troponins.  CT chest neg for PE or anything else.  Pain could be from a gi source, so I will start him on protonix.  He is to establish care with pcp.  He is to return if worse.    Reevaluation:  After the interventions noted above, I reevaluated the patient and found that they have :improved   Social Determinants of Health:  Lives at home   Dispostion:  After consideration of the diagnostic results and the patients response to treatment, I feel that the patent would benefit from discharge with outpatient f/u.          Final Clinical Impression(s) / ED Diagnoses Final diagnoses:  Atypical chest pain    Rx / DC Orders ED Discharge Orders          Ordered    pantoprazole (PROTONIX) 40 MG tablet  Daily        09/11/21 1728              Jacalyn Lefevre, MD 09/11/21 1730

## 2021-09-11 NOTE — ED Triage Notes (Addendum)
Pt with intermittent central chest pain, worse with movement and palpation x 2 weeks. Denies sob, n/v. No pain while at rest. Pt also reports burning with urination x 1 week.

## 2022-01-14 ENCOUNTER — Other Ambulatory Visit: Payer: Self-pay

## 2022-01-14 ENCOUNTER — Encounter (HOSPITAL_COMMUNITY): Payer: Self-pay | Admitting: Emergency Medicine

## 2022-01-14 ENCOUNTER — Emergency Department (HOSPITAL_COMMUNITY)
Admission: EM | Admit: 2022-01-14 | Discharge: 2022-01-15 | Disposition: A | Discharge status: Home / Self Care | Payer: Medicaid Other | Attending: Emergency Medicine | Admitting: Emergency Medicine

## 2022-01-14 DIAGNOSIS — Z20822 Contact with and (suspected) exposure to covid-19: Secondary | ICD-10-CM | POA: Insufficient documentation

## 2022-01-14 DIAGNOSIS — R1013 Epigastric pain: Secondary | ICD-10-CM | POA: Diagnosis present

## 2022-01-14 DIAGNOSIS — R1084 Generalized abdominal pain: Secondary | ICD-10-CM | POA: Insufficient documentation

## 2022-01-14 LAB — COMPREHENSIVE METABOLIC PANEL
ALT: 74 U/L — ABNORMAL HIGH (ref 0–44)
AST: 28 U/L (ref 15–41)
Albumin: 4.5 g/dL (ref 3.5–5.0)
Alkaline Phosphatase: 71 U/L (ref 38–126)
Anion gap: 11 (ref 5–15)
BUN: 12 mg/dL (ref 6–20)
CO2: 26 mmol/L (ref 22–32)
Calcium: 10.1 mg/dL (ref 8.9–10.3)
Chloride: 99 mmol/L (ref 98–111)
Creatinine, Ser: 0.95 mg/dL (ref 0.61–1.24)
GFR, Estimated: >60 mL/min (ref >60)
Glucose, Bld: 106 mg/dL — ABNORMAL HIGH (ref 70–99)
Potassium: 4 mmol/L (ref 3.5–5.1)
Sodium: 136 mmol/L (ref 135–145)
Total Bilirubin: 0.7 mg/dL (ref 0.3–1.2)
Total Protein: 8.4 g/dL — ABNORMAL HIGH (ref 6.5–8.1)

## 2022-01-14 LAB — CBC
HCT: 51.1 % (ref 39.0–52.0)
Hemoglobin: 16.9 g/dL (ref 13.0–17.0)
MCH: 29 pg (ref 26.0–34.0)
MCHC: 33.1 g/dL (ref 30.0–36.0)
MCV: 87.8 fL (ref 80.0–100.0)
Platelets: 299 10*3/uL (ref 150–400)
RBC: 5.82 MIL/uL — ABNORMAL HIGH (ref 4.22–5.81)
RDW: 14.3 % (ref 11.5–15.5)
WBC: 13.3 10*3/uL — ABNORMAL HIGH (ref 4.0–10.5)
nRBC: 0 % (ref 0.0–0.2)

## 2022-01-14 LAB — URINALYSIS, ROUTINE W REFLEX MICROSCOPIC
Bacteria, UA: NONE SEEN
Bilirubin Urine: NEGATIVE
Glucose, UA: NEGATIVE mg/dL
Hgb urine dipstick: NEGATIVE
Ketones, ur: 80 mg/dL — AB
Leukocytes,Ua: NEGATIVE
Nitrite: NEGATIVE
Protein, ur: 30 mg/dL — AB
Specific Gravity, Urine: 1.028 (ref 1.005–1.030)
pH: 5 (ref 5.0–8.0)

## 2022-01-14 LAB — RESP PANEL BY RT-PCR (FLU A&B, COVID) ARPGX2
Influenza A by PCR: NEGATIVE
Influenza B by PCR: NEGATIVE
SARS Coronavirus 2 by RT PCR: NEGATIVE

## 2022-01-14 LAB — LIPASE, BLOOD: Lipase: 23 U/L (ref 11–51)

## 2022-01-14 NOTE — ED Triage Notes (Addendum)
Pt in with chills, n/v, constipation & diarrhea x 1 wk with some epigastric pain. Pt states unable to keep food down for days. Also reports worsened anxiety over the past couple days.

## 2022-01-15 ENCOUNTER — Emergency Department (HOSPITAL_COMMUNITY): Payer: Medicaid Other

## 2022-01-15 MED ORDER — ONDANSETRON 4 MG PO TBDP: 4.0000 mg | ORAL_TABLET | Freq: Three times a day (TID) | ORAL | 0 refills | Status: AC | PRN

## 2022-01-15 MED ORDER — DICYCLOMINE HCL 20 MG PO TABS: 20.0000 mg | ORAL_TABLET | Freq: Two times a day (BID) | ORAL | 0 refills | Status: DC | PRN

## 2022-01-15 MED ORDER — DICYCLOMINE HCL 20 MG PO TABS: 20.0000 mg | ORAL_TABLET | Freq: Two times a day (BID) | ORAL | 0 refills | Status: AC | PRN

## 2022-01-15 MED ORDER — ONDANSETRON 4 MG PO TBDP: 4.0000 mg | ORAL_TABLET | Freq: Three times a day (TID) | ORAL | 0 refills | Status: DC | PRN

## 2022-01-15 MED ORDER — IOHEXOL 350 MG/ML SOLN
75.0000 mL | Freq: Once | INTRAVENOUS | Status: AC | PRN
  Administered 2022-01-15: 75 mL via INTRAVENOUS
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
## 2022-01-15 NOTE — Discharge Instructions (Addendum)
The cause of your pain was not found today.  Please follow-up with your family doctor for recheck.  You had a CT scan performed in the emergency department that showed multiple incidental findings that will need to be followed up by your family doctor for recheck.  IMPRESSION: 1. Mild wall thickening versus nondistention of the distal ascending and proximal transverse colon. No bowel obstruction or inflammation. 2. Moderate hepatic steatosis. 3. Umbilical and inguinal fat hernias. 4. L2-3 posterior disc osteophyte complex moderately effacing the thecal sac, with moderate spinal stenosis at L4-5 and L5-S1 primarily due to short pedicles.
